# Patient Record
Sex: Female | Born: 1985 | Race: Black or African American | Hispanic: No | Marital: Single | Smoking: Former smoker
Health system: Southern US, Community
[De-identification: ages and names within clinical notes are randomized; demographics above are authoritative.]

---

## 2018-01-25 ENCOUNTER — Encounter: Payer: Self-pay | Admitting: General Practice

## 2018-01-25 ENCOUNTER — Telehealth: Payer: Self-pay | Admitting: General Practice

## 2018-01-25 DIAGNOSIS — Z3490 Encounter for supervision of normal pregnancy, unspecified, unspecified trimester: Secondary | ICD-10-CM

## 2018-01-25 NOTE — Telephone Encounter (Signed)
[facility]enna [facility] called and left message on our nurse voicemail line stating the patient came in on 5/10 with a positive pregnancy test. Patient returned last night for an ultrasound and should be 8.2 weeks by LMP. Eileen StanfordJenna states the baby was measuring about 8.2 weeks on the ultrasound but there wasn't a heartbeat. They are calling to refer the patient for a formal ultrasound. Scheduled 6/11 @ 9am. Called & informed patient. Patient verbalized understanding & had no questions.

## 2018-01-31 ENCOUNTER — Encounter: Payer: Self-pay | Admitting: Medical

## 2018-01-31 ENCOUNTER — Encounter: Payer: Self-pay | Admitting: Family Medicine

## 2018-01-31 ENCOUNTER — Ambulatory Visit (INDEPENDENT_AMBULATORY_CARE_PROVIDER_SITE_OTHER): Payer: BC Managed Care – PPO | Admitting: Medical

## 2018-01-31 ENCOUNTER — Ambulatory Visit (HOSPITAL_COMMUNITY)
Admission: RE | Admit: 2018-01-31 | Discharge: 2018-01-31 | Disposition: A | Discharge status: Home / Self Care | Payer: BC Managed Care – PPO | Source: Ambulatory Visit | Attending: Medical | Admitting: Medical

## 2018-01-31 VITALS — BP 117/77 | HR 73 | Ht 65.0 in | Wt 165.0 lb

## 2018-01-31 DIAGNOSIS — Z3490 Encounter for supervision of normal pregnancy, unspecified, unspecified trimester: Secondary | ICD-10-CM

## 2018-01-31 DIAGNOSIS — O021 Missed abortion: Secondary | ICD-10-CM

## 2018-01-31 DIAGNOSIS — Z3A08 8 weeks gestation of pregnancy: Secondary | ICD-10-CM | POA: Diagnosis not present

## 2018-01-31 DIAGNOSIS — O283 Abnormal ultrasonic finding on antenatal screening of mother: Secondary | ICD-10-CM | POA: Diagnosis not present

## 2018-01-31 MED ORDER — IBUPROFEN 600 MG PO TABS: 600.0000 mg | ORAL_TABLET | Freq: Four times a day (QID) | ORAL | 1 refills | Status: AC | PRN

## 2018-01-31 MED ORDER — MISOPROSTOL 200 MCG PO TABS: 800.0000 ug | ORAL_TABLET | Freq: Once | ORAL | 0 refills | Status: DC

## 2018-01-31 MED ORDER — OXYCODONE-ACETAMINOPHEN 5-325 MG PO TABS: 1.0000 | ORAL_TABLET | ORAL | 0 refills | Status: DC | PRN

## 2018-01-31 MED ORDER — PROMETHAZINE HCL 12.5 MG PO TABS: 12.5000 mg | ORAL_TABLET | Freq: Four times a day (QID) | ORAL | 0 refills | Status: AC | PRN

## 2018-01-31 NOTE — Progress Notes (Signed)
History:  [name] is a 32 y.o. G1P0 who presents to clinic today for [place] results for viability. [facility] and had SIUP without FHR noted. She was referred to [place] for further evaluation of pregnancy viability. Patient denies abdominal pain, vaginal bleeding or fever today. This is her first pregnancy. She is a T2DM on Metformin with moderate control of blood sugar at this time.   The following portions of the patient's history were reviewed and updated as appropriate: allergies, current medications, family history, past medical history, social history, past surgical history and problem list.  Review of Systems:  Review of Systems  Constitutional: Negative for fever.  Gastrointestinal: Negative for abdominal pain.  Genitourinary:       Neg - vaginal bleeding      Objective:  Physical Exam BP 117/77   Pulse 73   Ht 5\' 5"  (1.651 m)   Wt 165 lb (74.8 kg)   LMP 11/27/2017 (Exact Date)   BMI 27.46 kg/m  Physical Exam  Constitutional: She is oriented to person, place, and time. She appears well-developed and well-nourished. No distress.  HENT:  Head: Normocephalic.  Cardiovascular: Normal rate.  Pulmonary/Chest: Effort normal.  Abdominal: Soft.  Neurological: She is alert and oriented to person, place, and time.  Skin: Skin is warm and dry. No erythema.  Psychiatric: She has a normal mood and affect.  Vitals reviewed.  Labs and Imaging US Ob Less Than 14 Weeks With Ob Transvaginal  Result Date: 01/31/2018 CLINICAL DATA:  Pregnant, inconclusive fetal viability, no fetal heart tones in office EXAM: OBSTETRIC <14 WK [place] AND TRANSVAGINAL OB US TECHNIQUE: Both transabdominal and transvaginal ultrasound examinations were performed for complete evaluation of the gestation as well as the maternal uterus, adnexal regions, and pelvic cul-de-sac. Transvaginal technique was performed to assess early pregnancy. COMPARISON:  None. FINDINGS:  Intrauterine gestational sac: Single Yolk sac:  Visualized. Embryo:  Visualized. Cardiac Activity: Not Visualized. CRL:  17.5 mm   8 w   1 d Subchorionic hemorrhage:  None visualized. Maternal uterus/adnexae: Multiple uterine fibroids. Bilateral ovaries are within normal limits. No free fluid. IMPRESSION: Single intrauterine gestation, measuring 8 weeks 1 day by crown-rump length, without cardiac activity. Findings meet definitive criteria for failed pregnancy. This follows SRU consensus guidelines: Diagnostic Criteria for Nonviable Pregnancy Early in the First Trimester. [employer] Mis J Med [phone]. These results will be called to the ordering clinician or representative by the Radiologist Assistant, and communication documented in the PACS or zVision Dashboard. Electronically Signed   By: Charline Bills M.D.   On: 01/31/2018 09:56     Assessment & Plan:  1. Missed ab - misoprostol (CYTOTEC) 200 MCG tablet; Place 4 tablets (800 mcg total) vaginally once for 1 dose.  Dispense: 4 tablet; Refill: 0 - oxyCODONE-acetaminophen (PERCOCET/ROXICET) 5-325 MG tablet; Take 1 tablet by mouth every 4 (four) hours as needed for severe pain.  Dispense: 12 tablet; Refill: 0 - ibuprofen (ADVIL,MOTRIN) 600 MG tablet; Take 1 tablet (600 mg total) by mouth every 6 (six) hours as needed.  Dispense: 30 tablet; Refill: 1 - promethazine (PHENERGAN) 12.5 MG tablet; Take 1 tablet (12.5 mg total) by mouth every 6 (six) hours as needed for nausea or vomiting.  Dispense: 30 tablet; Refill: 0 - Bleeding precautions discussed - Patient to return to [facility] in 1 week for hCG only and 2 weeks for follow-up with a provider - Patient advised to call the office if  no results from Cytotec after 24 hours for repeat dose - Warning signs discussed and reasons to present to [facility] reviewed - Work note given   Marny LowensteinWenzel, Nialah Saravia N, PA-C 01/31/2018 10:34 AM

## 2018-01-31 NOTE — Patient Instructions (Signed)

## 2018-02-06 ENCOUNTER — Other Ambulatory Visit: Payer: Self-pay

## 2018-02-06 DIAGNOSIS — O039 Complete or unspecified spontaneous abortion without complication: Secondary | ICD-10-CM

## 2018-02-07 ENCOUNTER — Other Ambulatory Visit: Payer: BC Managed Care – PPO

## 2018-02-07 DIAGNOSIS — O039 Complete or unspecified spontaneous abortion without complication: Secondary | ICD-10-CM

## 2018-02-08 LAB — BETA HCG QUANT (REF LAB): hCG Quant: 4467 m[IU]/mL

## 2018-02-13 ENCOUNTER — Telehealth: Payer: Self-pay | Admitting: General Practice

## 2018-02-13 ENCOUNTER — Other Ambulatory Visit: Payer: Self-pay | Admitting: Medical

## 2018-02-13 DIAGNOSIS — O021 Missed abortion: Secondary | ICD-10-CM

## 2018-02-13 MED ORDER — OXYCODONE-ACETAMINOPHEN 5-325 MG PO TABS: 1.0000 | ORAL_TABLET | ORAL | 0 refills | Status: AC | PRN

## 2018-02-13 NOTE — Telephone Encounter (Signed)
Patient called and left message on nurse voicemail line stating she was seen last week and the week before for a miscarriage. Patient states she was given a Rx for percocet and didn't need it so she got rid of it and now she feels like she needs it.  Called patient and she states she took the cytotec last Monday and is still bleeding moderately. Patient reports pain increasing on Thursday and the ibuprofen isn't helping. Called Vonzella NippleJulie Wenzel who will send in additional percocet. Patient informed. Patient verbalized understanding & had no questions.

## 2018-02-14 ENCOUNTER — Ambulatory Visit (INDEPENDENT_AMBULATORY_CARE_PROVIDER_SITE_OTHER): Payer: BC Managed Care – PPO | Admitting: Advanced Practice Midwife

## 2018-02-14 ENCOUNTER — Encounter: Payer: Self-pay | Admitting: Advanced Practice Midwife

## 2018-02-14 VITALS — BP 123/81 | HR 77 | Wt 164.0 lb

## 2018-02-14 DIAGNOSIS — O039 Complete or unspecified spontaneous abortion without complication: Secondary | ICD-10-CM

## 2018-02-14 NOTE — Patient Instructions (Addendum)
Intrauterine Device Information An intrauterine device (IUD) is inserted into your uterus to prevent pregnancy. There are two types of IUDs available:  Copper IUD-This type of IUD is wrapped in copper wire and is placed inside the uterus. Copper makes the uterus and fallopian tubes produce a fluid that kills sperm. The copper IUD can stay in place for 10 years.  Hormone IUD-This type of IUD contains the hormone progestin (synthetic progesterone). The hormone thickens the cervical mucus and prevents sperm from entering the uterus. It also thins the uterine lining to prevent implantation of a fertilized egg. The hormone can weaken or kill the sperm that get into the uterus. One type of hormone IUD can stay in place for 5 years, and another type can stay in place for 3 years.  Your health care provider will make sure you are a good candidate for a contraceptive IUD. Discuss with your health care provider the possible side effects. Advantages of an intrauterine device  IUDs are highly effective, reversible, long acting, and low maintenance.  There are no estrogen-related side effects.  An IUD can be used when breastfeeding.  IUDs are not associated with weight gain.  The copper IUD works immediately after insertion.  The hormone IUD works right away if inserted within 7 days of your period starting. You will need to use a backup method of birth control for 7 days if the hormone IUD is inserted at any other time in your cycle.  The copper IUD does not interfere with your female hormones.  The hormone IUD can make heavy menstrual periods lighter and decrease cramping.  The hormone IUD can be used for 3 or 5 years.  The copper IUD can be used for 10 years. Disadvantages of an intrauterine device  The hormone IUD can be associated with irregular bleeding patterns.  The copper IUD can make your menstrual flow heavier and more painful.  You may experience cramping and vaginal bleeding after  insertion. This information is not intended to replace advice given to you by your health care provider. Make sure you discuss any questions you have with your health care provider. Document Released: 07/13/2004 Document Revised: 01/15/2016 Document Reviewed: 01/28/2013 Elsevier Interactive Patient Education  [address]2017 Elsevier Inc.   [place], [place], [place], [place], [place]

## 2018-02-14 NOTE — Progress Notes (Signed)
cytotec taken 6/17, bleeding heavy x 2-3, still menstrual bleeding CBC, hcg, abo/rh  Interested in IUD for contraception, not placed today, wait until at least today's hcg to make sure it is dropping, pt will not need D&C   GYNECOLOGY PROGRESS NOTE  History:  32 y.o. G1P0 [facility] for post miscarriage visit. On initial presentation on 01/31/18 US showed 8 week IUP without FHR so definitive for failed pregnancy.  Cytotec was prescribed.  Pt took Cytotec on 02/06/18, hcg on 02/07/18 was 4467.  Pt still having menstrual-like bleeding, changing pad every 3-4 hours today.  She denies pain.  She desires IUD for contraception.   She denies h/a, dizziness, shortness of breath, n/v, or fever/chills.    The following portions of the patient's history were reviewed and updated as appropriate: allergies, current medications, past family history, past medical history, past social history, past surgical history and problem list.   Review of Systems:  Pertinent items are noted in HPI.   Objective:  Physical Exam Blood pressure 123/81, pulse 77, weight 164 lb (74.4 kg), last menstrual period 11/27/2017. VS reviewed, nursing note reviewed,  Constitutional: well developed, well nourished, no distress HEENT: normocephalic CV: normal rate Pulm/chest wall: normal effort Breast Exam: deferred Abdomen: soft Neuro: alert and oriented x 3 Skin: warm, dry Psych: affect normal Pelvic exam: Deferred  Assessment & Plan:  1. SAB (spontaneous abortion) --Pt having appropriate response to Cytotec.   --Due to 8 week pregnancy and last hcg level of 4000+, will retest hcg today and consider IUD when hcg confirmed to be dropping indicating success of Cytotec management.  If hcg not dropping, consider second dose or D&C. --hcg, ABO/Rh, CBC today   Sharen CounterLisa Leftwich-Kirby, CNM 10:00 AM

## 2018-02-15 LAB — CBC
HEMOGLOBIN: 10.5 g/dL — AB (ref 11.1–15.9)
Hematocrit: 34.5 % (ref 34.0–46.6)
MCH: 24.9 pg — ABNORMAL LOW (ref 26.6–33.0)
MCHC: 30.4 g/dL — AB (ref 31.5–35.7)
MCV: 82 fL (ref 79–97)
PLATELETS: 473 10*3/uL — AB (ref 150–450)
RBC: 4.22 x10E6/uL (ref 3.77–5.28)
RDW: 14.5 % (ref 12.3–15.4)
WBC: 11.2 10*3/uL — ABNORMAL HIGH (ref 3.4–10.8)

## 2018-02-15 LAB — ABO

## 2018-02-15 LAB — HCG, SERUM, QUALITATIVE: HCG, BETA SUBUNIT, QUAL, SERUM: POSITIVE m[IU]/mL — AB (ref <6)

## 2018-02-17 ENCOUNTER — Telehealth: Payer: Self-pay | Admitting: *Deleted

## 2018-02-17 NOTE — Telephone Encounter (Signed)
Lawson FiscalLori called and left a message she is calling about her results from Tuesday.

## 2018-02-20 ENCOUNTER — Other Ambulatory Visit: Payer: Self-pay | Admitting: General Practice

## 2018-02-20 DIAGNOSIS — O039 Complete or unspecified spontaneous abortion without complication: Secondary | ICD-10-CM

## 2018-02-20 NOTE — Telephone Encounter (Signed)
Patient called and left another message requesting results.  Called patient & apologized but the wrong lab order was placed and so I cannot tell her what her numbers were. Discussed the test that was ordered was a positive/negative test. Discussed lab appt tomorrow & I would ensure her orders are correct. Patient verbalized understanding & had no questions.

## 2018-02-21 ENCOUNTER — Other Ambulatory Visit: Payer: BC Managed Care – PPO

## 2018-02-21 DIAGNOSIS — O039 Complete or unspecified spontaneous abortion without complication: Secondary | ICD-10-CM

## 2018-02-22 LAB — ABO/RH: RH TYPE: POSITIVE

## 2018-02-22 LAB — BETA HCG QUANT (REF LAB): hCG Quant: 28 m[IU]/mL

## 2018-03-08 ENCOUNTER — Other Ambulatory Visit: Payer: BC Managed Care – PPO

## 2018-03-08 ENCOUNTER — Other Ambulatory Visit: Payer: Self-pay | Admitting: *Deleted

## 2018-03-08 DIAGNOSIS — O039 Complete or unspecified spontaneous abortion without complication: Secondary | ICD-10-CM

## 2018-03-08 NOTE — Progress Notes (Signed)
.  bh

## 2018-03-10 LAB — SPECIMEN STATUS REPORT

## 2018-03-10 LAB — BETA HCG QUANT (REF LAB): hCG Quant: 181 m[IU]/mL

## 2018-03-10 LAB — RH TYPE: Rh Factor: POSITIVE

## 2020-03-18 IMAGING — US US OB < 14 WEEKS - US OB TV
1 series · 15 of 28 positions shown · non-contrast
Comparison: None.

CLINICAL DATA: Pregnant, inconclusive fetal viability, no fetal
heart tones in office

EXAM:
OBSTETRIC <14 WK US AND TRANSVAGINAL OB US
TECHNIQUE: Both transabdominal and transvaginal ultrasound examinations were
performed for complete evaluation of the gestation as well as the
maternal uterus, adnexal regions, and pelvic cul-de-sac.
Transvaginal technique was performed to assess early pregnancy.

[Series 1: us ob < 14 weeks - us ob tv · 65 acquisitions, 15 frames shown]
[im 1/65]
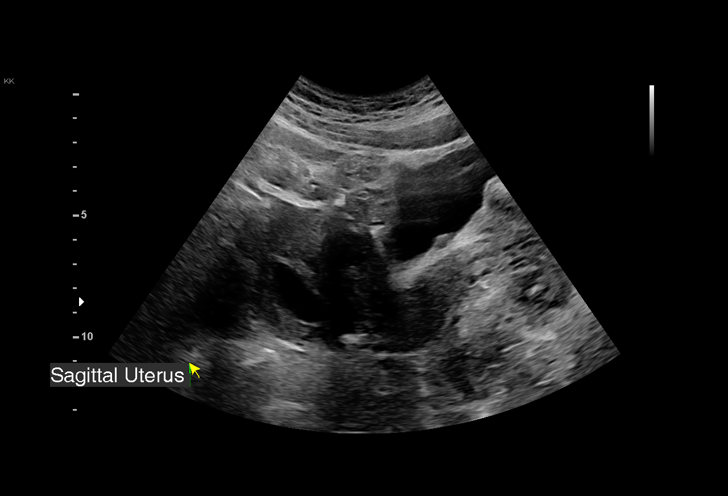
[im 5/65]
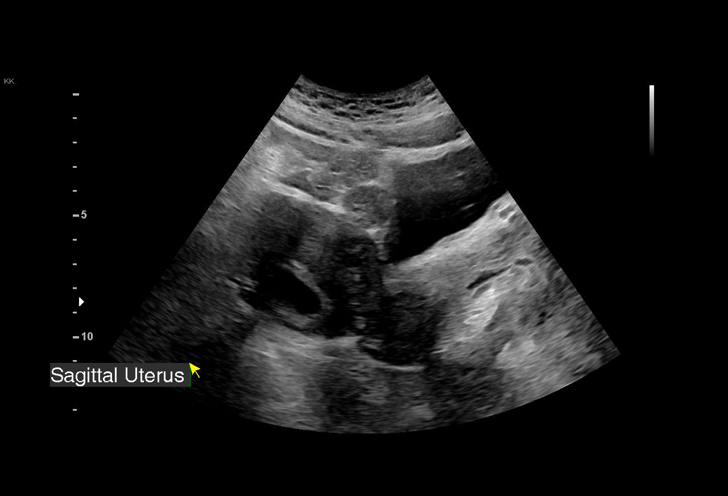
[im 10/65]
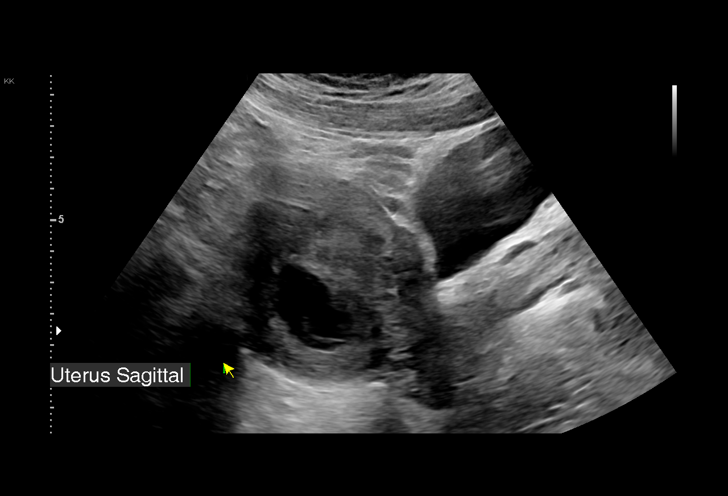
[im 15/65]
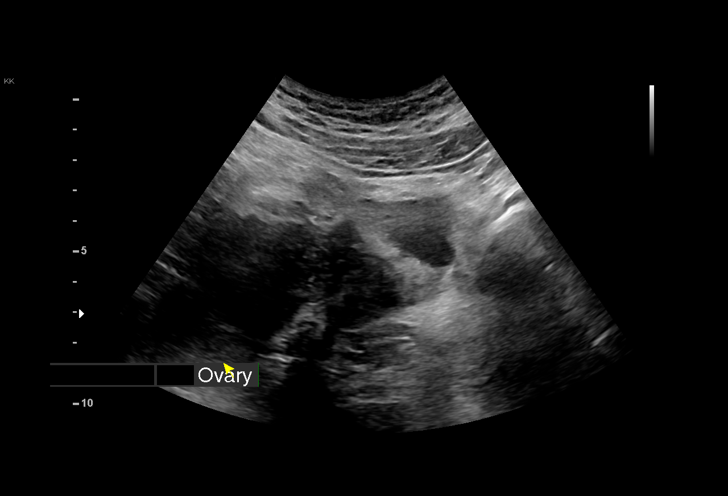
[im 19/65]
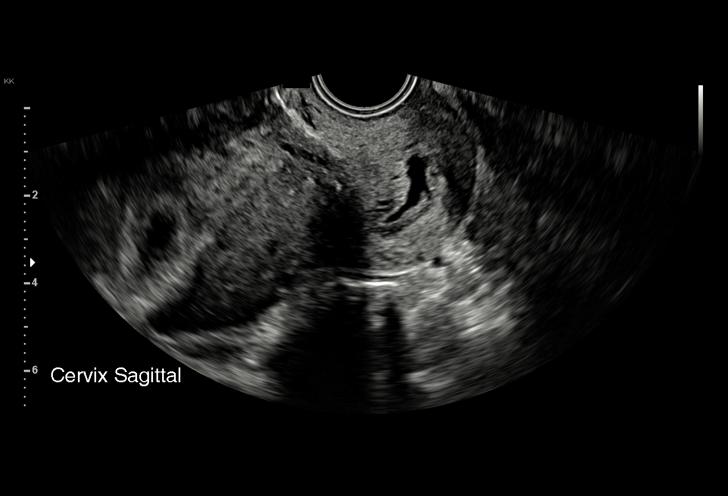
[im 24/65]
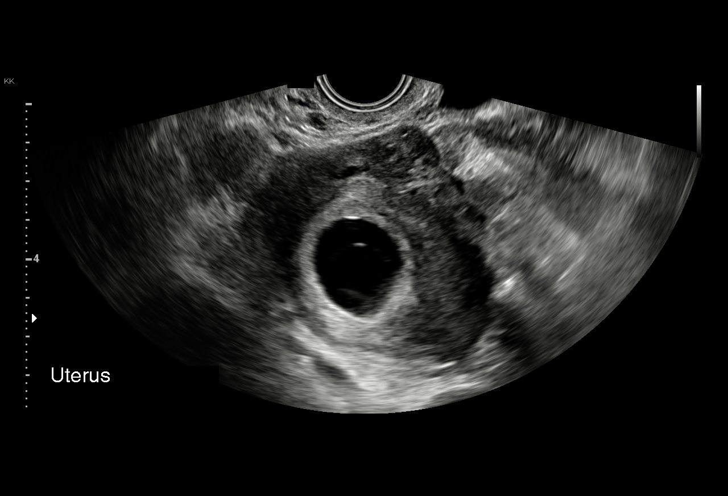
[im 29/65]
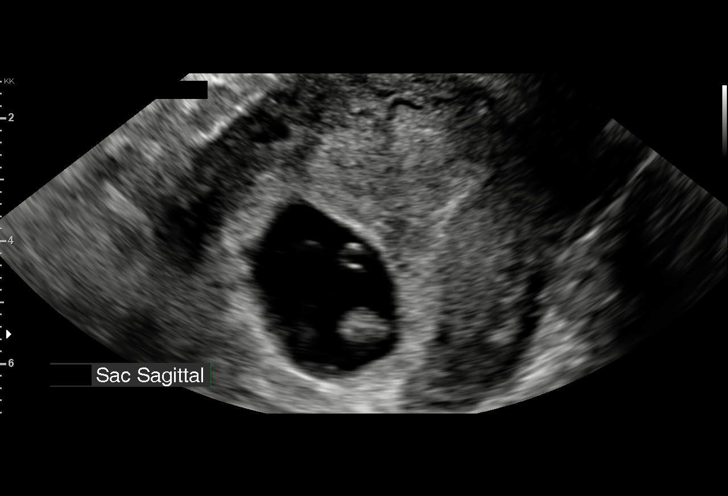
[im 34/65]
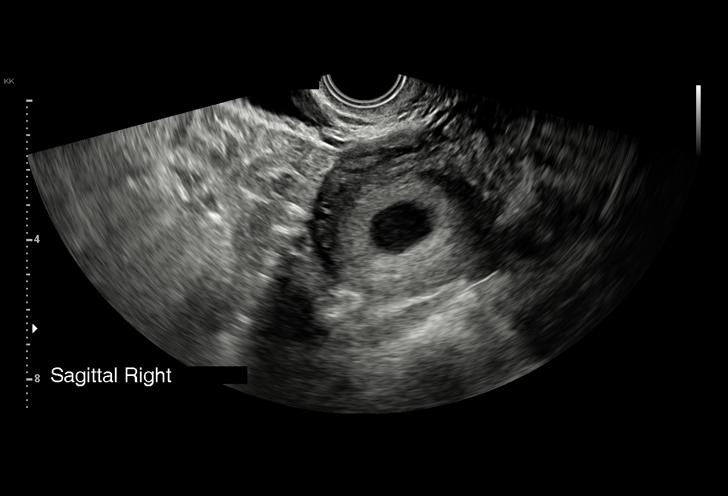
[im 36/65]
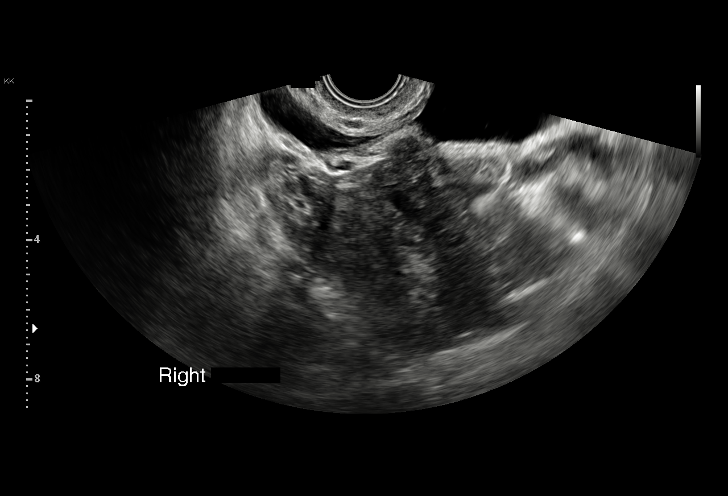
[im 41/65]
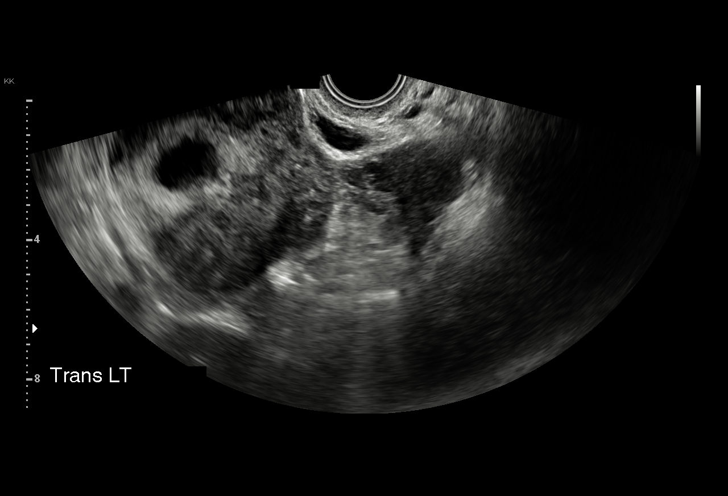
[im 46/65]
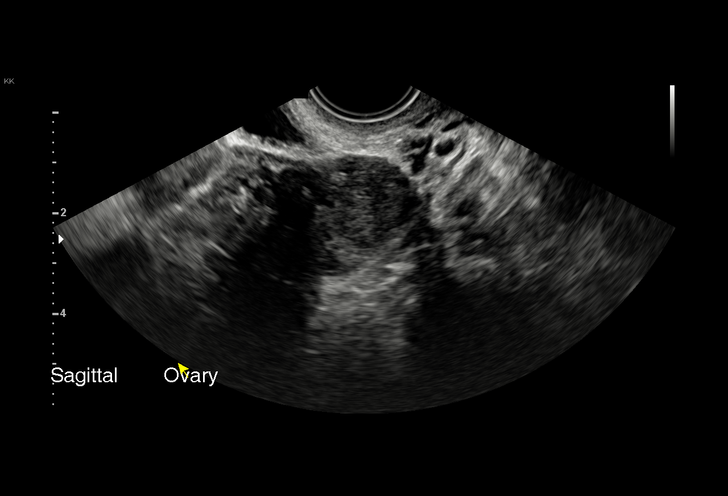
[im 50/65]
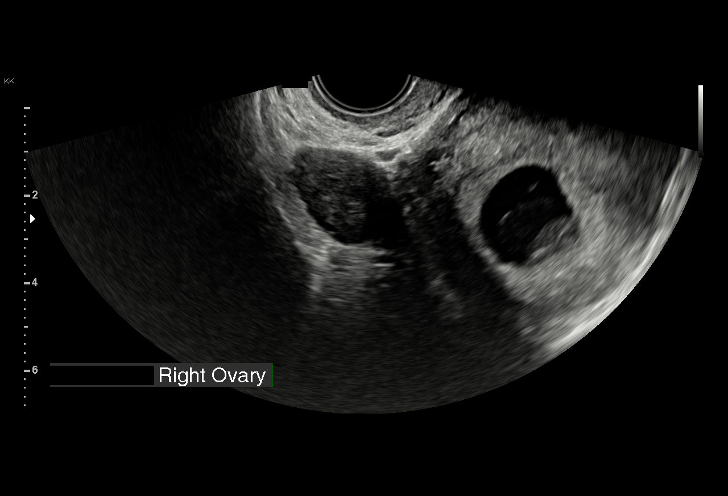
[im 55/65]
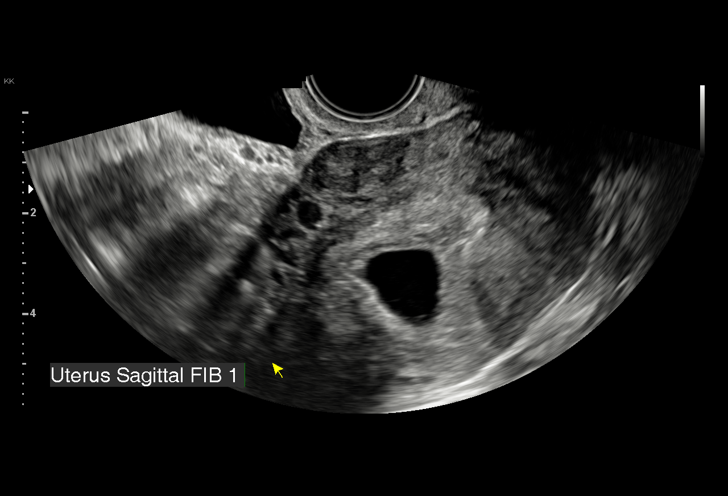
[im 60/65]
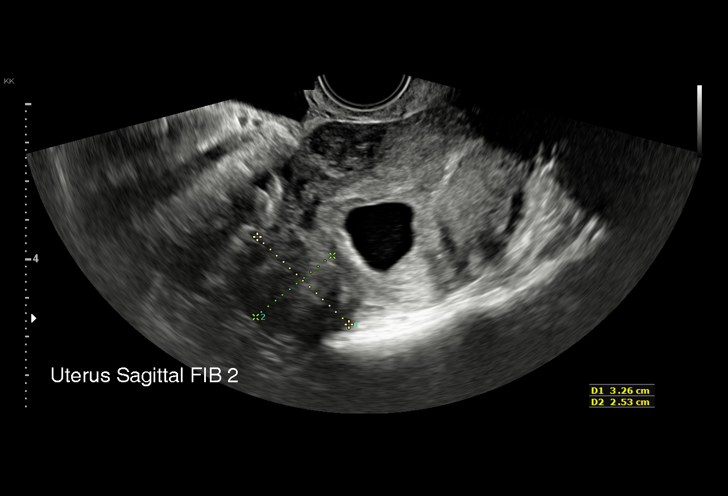
[im 65/65]
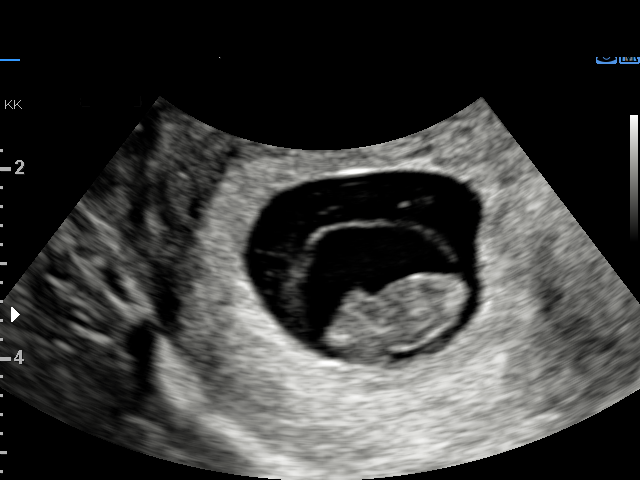

[15 of 28 positions shown; findings below may reference images not displayed]

FINDINGS: Intrauterine gestational sac: Single

Yolk sac:  Visualized.

Embryo:  Visualized.

Cardiac Activity: Not Visualized.

CRL:  17.5 mm   8 w   1 d

Subchorionic hemorrhage:  None visualized.

Maternal uterus/adnexae: Multiple uterine fibroids.

Bilateral ovaries are within normal limits.

No free fluid.
IMPRESSION: Single intrauterine gestation, measuring 8 weeks 1 day by crown-rump
length, without cardiac activity.

Findings meet definitive criteria for failed pregnancy. This follows
SRU consensus guidelines: Diagnostic Criteria for Nonviable
Pregnancy Early in the First Trimester. N Engl J Med
1618;[DATE].

These results will be called to the ordering clinician or
representative by the Radiologist Assistant, and communication
documented in the PACS or zVision Dashboard.
